# Patient Record
Sex: Male | Born: 1985 | Race: Black or African American | Hispanic: No | Marital: Single | Smoking: Current every day smoker
Health system: Southern US, Community
[De-identification: ages and names within clinical notes are randomized; demographics above are authoritative.]

## PROBLEM LIST (undated history)

## (undated) DIAGNOSIS — I10 Essential (primary) hypertension: Secondary | ICD-10-CM

---

## 2018-02-04 ENCOUNTER — Other Ambulatory Visit: Payer: Self-pay

## 2018-02-04 ENCOUNTER — Emergency Department (HOSPITAL_BASED_OUTPATIENT_CLINIC_OR_DEPARTMENT_OTHER)
Admission: EM | Admit: 2018-02-04 | Discharge: 2018-02-04 | Disposition: A | Discharge status: Home / Self Care | Payer: Self-pay | Attending: Emergency Medicine | Admitting: Emergency Medicine

## 2018-02-04 ENCOUNTER — Emergency Department (HOSPITAL_BASED_OUTPATIENT_CLINIC_OR_DEPARTMENT_OTHER): Payer: Self-pay

## 2018-02-04 ENCOUNTER — Encounter (HOSPITAL_BASED_OUTPATIENT_CLINIC_OR_DEPARTMENT_OTHER): Payer: Self-pay | Admitting: *Deleted

## 2018-02-04 DIAGNOSIS — M79671 Pain in right foot: Secondary | ICD-10-CM | POA: Insufficient documentation

## 2018-02-04 DIAGNOSIS — F17210 Nicotine dependence, cigarettes, uncomplicated: Secondary | ICD-10-CM | POA: Insufficient documentation

## 2018-02-04 DIAGNOSIS — I10 Essential (primary) hypertension: Secondary | ICD-10-CM | POA: Insufficient documentation

## 2018-02-04 DIAGNOSIS — R030 Elevated blood-pressure reading, without diagnosis of hypertension: Secondary | ICD-10-CM

## 2018-02-04 HISTORY — DX: Essential (primary) hypertension: I10

## 2018-02-04 NOTE — ED Notes (Signed)
Pt verbalizes understanding of d/c instructions and denies any further needs at this time. 

## 2018-02-04 NOTE — ED Triage Notes (Signed)
Pt has been having right foot pain X 4 days, intensified yesterday. No trauma to area. Unsure what brought on the pain.

## 2018-02-04 NOTE — Discharge Instructions (Addendum)
Please return to the Emergency Department for any new or worsening symptoms or if your symptoms do not improve. Please be sure to follow up with your Primary Care Physician as soon as possible regarding your visit today. If you do not have a Primary Doctor please use the resources below to establish one. Please use rest ice and elevation to help relieve your foot pain.  You may also use the crutches as needed to help with ambulation and rest your foot.  You may use over-the-counter anti-inflammatory such as ibuprofen to help with your pain, use as directed on the packaging. Please follow-up with the sports medicine doctor Dr. Pearletha Forge for further evaluation of your foot pain. Your blood pressure was elevated today, please take your blood pressure medications as directed by your primary care doctor.  Please follow-up with your primary care doctor for a blood pressure recheck and medication management as soon as possible.  Contact a health care provider if: Your pain does not get better after a few days of self-care. Your pain gets worse. You cannot stand on your foot. Get help right away if: Your foot is numb or tingling. Your foot or toes are swollen. Your foot or toes turn white or blue. You have warmth and redness along your foot. Contact a doctor if: You think you are having a reaction to the medicine you are taking. You have headaches that keep coming back (recurring). You feel dizzy. You have swelling in your ankles. You have trouble with your vision. Get help right away if: You get a very bad headache. You start to feel confused. You feel weak or numb. You feel faint. You get very bad pain in your: Chest. Belly (abdomen). You throw up (vomit) more than once. You have trouble breathing.   Do not take your medicine if  develop an itchy rash, swelling in your mouth or lips, or difficulty breathing.   RESOURCE GUIDE  Chronic Pain Problems: Contact [facility]  [phone] Patients need to be referred by their primary care doctor.  Insufficient Money for Medicine: Contact United Way:  call "211" or [facility] (304)412-2048.  No Primary Care Doctor: Call [facility]  [phone] - can help you locate a primary care doctor that  accepts your insurance, provides certain services, etc. Physician Referral [phone]  Agencies that provide inexpensive medical care: [facility]  [phone] [facility]  [phone] [facility]  [phone] [facility]  [phone] Planned [facility]  [phone] [facility]  [phone]  Medicaid-accepting [facility] Providers: [facility]- [address]  [phone], Mon-Fri 9am-7pm, Sat 9am-1pm [facility]- [address] [place], [address] [place]  [phone] [facility]- [address] [place]  [phone] [facility]- [address]  [phone] Renaye Rakers- [address] [place], [address], [phone]  Only accepts [place] patients after they have their name  applied to their card  Self Pay (no insurance) in [facility]: Sickle Cell Patients: Dr Willey Blade, [facility]  [address] [place], [phone] [facility]- [address] [place]  [phone]       [facility] [place]- [address]       -     [facility]- see information above (Speak to [employer]  if you do not have insurance)       -  [facility]- [address] [place], [phone]       -  [facility]- [address],  [phone]       -  [facility]- [address] [phone]       -  Dr Julio Sicks-  [address], [place], [phone]       -  [facility]- [address], [phone]       -  [facility]- [address], [phone], also [address], [phone]       -    [facility]- [address] [place], [phone], 1st & 3rd Saturday   every month, 10am-1pm  1) Find a Doctor and Pay Out of Pocket Although you won't have to find out who is covered by your insurance plan, it is a good idea to ask around and get recommendations. You will then need to call the office and see if the doctor you have chosen will accept you as a new patient and what types of options they offer for patients who are self-pay. Some doctors offer discounts or will set up payment plans for their patients who do not have insurance, but you will need to ask so you aren't surprised when you get to your appointment.  2) Contact Your Local Health Department Not all health departments have doctors that can see patients for sick visits, but many do, so it is worth a call to see if yours does. If you don't know where your local health department is, you can check in your phone book. The CDC also has a tool to help you locate your state's health department, and many state websites also have listings of all of their local health departments.  3) Find a Walk-in Clinic If your illness is not likely to be very severe or complicated, you may want to try a walk in clinic. These are popping up all over the country in pharmacies, drugstores, and shopping centers. They're usually staffed by nurse practitioners or physician assistants that have been trained to treat common illnesses and complaints. They're usually fairly quick and inexpensive. However, if you have serious medical issues or chronic medical problems, these are probably not your best option  STD Testing [facility] Department of [facility], STD Clinic, [address] [place], phone [phone] or [phone].  Monday - Friday, call for an appointment. [facility] Department of [employer], STD Clinic, [address], [place], phone [phone] or [phone].   Monday - Friday, call for an appointment.  Abuse/Neglect: [facility] Child Abuse Hotline 314-038-3883 [facility] Child Abuse Hotline 3134151498 (After Hours)  Emergency Shelter:  [facility] 253-072-8189  Maternity Homes: [facility] (249)192-3201 [facility] 206-285-7808  MRSA Hotline #:   7168180918  [facility] Resources  [facility]  [employer] Way [facility] Dept. [address]                 [address]         [address] [place] [address]  [address]  Cristobal Goldmann Phone:  [phone]                                  Phone:  [phone]                   Phone:  [phone]  [facility], [phone] [facility]- [phone]       -     [facility] in [place], [address],                                  2891066632, [facility] Child Abuse Hotline 959-472-7608 or 734-797-3323 (After Hours)   Behavioral Health Services  Substance Abuse Resources: [facility] Drug Services  (208)742-9653 [facility] [phone] [facility] [place] [phone] Floydene Flock [phone] Residential & Outpatient Substance Abuse Program  306-774-5996  Psychological Services: [facility]  [phone] [facility]  ([phone] [facility], [phone] [place]. [address] [place], ACCESS LINE: 518-839-0819 or 717-556-8821, [employer]  Dental Assistance  If unable to pay or uninsured, contact:  [facility] or [facility]. to become qualified for the adult dental clinic.  Patients with Medicaid: [facility] [phone] [address], [phone] [address] [phone]  If unable to  pay, or uninsured, contact [facility] (765) 370-5508) or [facility] Department [phone])[phone] in [place], [phone] in [place]) to become qualified for the adult dental clinic   Other Low-Cost Community Dental Services: [facility]- [address] [place], [place], [place], [address], [phone], Ext. [phone], 2nd and 4th Thursday of the month at 6:30am.  10 clients each day by appointment, can sometimes see walk-in patients if someone does not show for an appointment. [facility]- [address] [place], [place], [address], [phone] [facility] [address] [place], [place], [address], [phone] [facility] Health Department- [phone] [facility] Health Department- [phone] [facility] [phone]

## 2018-02-04 NOTE — ED Provider Notes (Signed)
[facility] EMERGENCY DEPARTMENT Provider Note   CSN: [id number] Arrival date & time: 02/04/18  1313     History   Chief Complaint Chief Complaint  Patient presents with  . Foot Pain    HPI Bruce Wright is a 32 y.o. male presenting for 4 days of right foot pain that he describes it at this a constant throbbing pain that is moderate in severity.  States that pain is worse with ambulation and palpation to the bottom of the foot.  Patient denies trauma or injury to the area.  Patient states that he has had this pain a few years ago in the past that went away on its own.  Patient states that at that time he was informed to use diet control for his weight and rest the foot.  He states that this worked for a time.  Patient denies fever, foot swelling/color change, injury, numbness/tingling or weakness.  Patient denies headache, vision changes, chest pain or shortness of breath.  HPI  Past Medical History:  Diagnosis Date  . Hypertension     There are no active problems to display for this patient.   History reviewed. No pertinent surgical history.      Home Medications    Prior to Admission medications   Not on File    Family History History reviewed. No pertinent family history.  Social History Social History   Tobacco Use  . Smoking status: Current Every Day Smoker    Packs/day: 0.50  . Smokeless tobacco: Never Used  Substance Use Topics  . Alcohol use: Yes    Alcohol/week: 1.0 standard drinks    Types: 1 Shots of liquor per week  . Drug use: Yes    Types: Marijuana     Allergies   Patient has no known allergies.   Review of Systems Review of Systems  Constitutional: Negative.  Negative for chills and fever.  Eyes: Negative.  Negative for visual disturbance.  Respiratory: Negative.  Negative for shortness of breath.   Cardiovascular: Negative.  Negative for chest pain.  Musculoskeletal: Positive for arthralgias. Negative for joint  swelling and neck pain.  Skin: Negative.  Negative for color change and wound.  Neurological: Negative.  Negative for weakness, numbness and headaches.    Physical Exam Updated Vital Signs BP (!) 162/119 (BP Location: Left Arm)   Pulse 82   Temp 99.1 F (37.3 C) (Oral)   Resp 18   Ht 5\' 11"  (1.803 m)   Wt (!) 161.9 kg   SpO2 100%   BMI 49.79 kg/m   Physical Exam  Constitutional: He appears well-developed and well-nourished. No distress.  HENT:  Head: Normocephalic and atraumatic.  Right Ear: External ear normal.  Left Ear: External ear normal.  Nose: Nose normal.  Eyes: Pupils are equal, round, and reactive to light. EOM are normal.  Neck: Trachea normal and normal range of motion. No tracheal deviation present.  Cardiovascular:  Pulses:      Dorsalis pedis pulses are 2+ on the right side, and 2+ on the left side.       Posterior tibial pulses are 2+ on the right side, and 2+ on the left side.  Pulmonary/Chest: Effort normal. No respiratory distress.  Abdominal: Soft. There is no tenderness. There is no rebound and no guarding.  Musculoskeletal: Normal range of motion.       Right ankle: Normal.       Left ankle: Normal.       Right lower  leg: Normal.       Left lower leg: Normal.       Right foot: There is tenderness. There is no swelling, normal capillary refill and no deformity.       Left foot: Normal. There is no deformity.  Feet:  Right Foot:  Protective Sensation: 3 sites tested. 3 sites sensed.  Left Foot:  Protective Sensation: 3 sites tested. 3 sites sensed.  Neurological: He is alert. He has normal strength. No sensory deficit. GCS eye subscore is 4. GCS verbal subscore is 5. GCS motor subscore is 6.  Speech is clear and goal oriented, follows commands Major Cranial nerves without deficit, no facial droop Normal strength in upper and lower extremities bilaterally including dorsiflexion and plantar flexion, strong and equal grip strength Sensation normal to  light touch Moves extremities without ataxia, coordination intact  Skin: Skin is warm and dry. Capillary refill takes less than 2 seconds. No erythema.  Psychiatric: He has a normal mood and affect. His behavior is normal.   ED Treatments / Results  Labs (all labs ordered are listed, but only abnormal results are displayed) Labs Reviewed - No data to display  EKG None  Radiology Dg Ankle Complete Right  Result Date: 02/04/2018 CLINICAL DATA:  Initial encounter. 32 y/o male with RIGHT foot/ankle pain. NKI. Started at 5th digit, now everywhere. No prior injury. Pt will not bear weight on that side. EXAM: RIGHT ANKLE - COMPLETE 3+ VIEW COMPARISON:  None. FINDINGS: No fracture.  No bone lesion. Ankle joint normally spaced and aligned.  No arthropathic changes. Small dorsal calcaneal spur. Soft tissues are unremarkable. IMPRESSION: No fracture, bone lesion or ankle joint abnormality. Electronically Signed   By: Amie Portland M.D.   On: 02/04/2018 15:19   Dg Foot Complete Right  Result Date: 02/04/2018 CLINICAL DATA:  Initial encounter. 32 y/o male with RIGHT foot/ankle pain. NKI. Started at 5th digit, now everywhere. No prior injury. Pt will not bear weight on that side. EXAM: RIGHT FOOT COMPLETE - 3+ VIEW COMPARISON:  None. FINDINGS: No fracture.  No bone lesion. Joints are normally spaced and aligned.  No arthropathic changes. Soft tissues are unremarkable. IMPRESSION: Negative. Electronically Signed   By: Amie Portland M.D.   On: 02/04/2018 15:57    Procedures Procedures (including critical care time)  Medications Ordered in ED Medications - No data to display   Initial Impression / Assessment and Plan / ED Course  I have reviewed the triage vital signs and the nursing notes.  Pertinent labs & imaging results that were available during my care of the patient were reviewed by me and considered in my medical decision making (see chart for details).    The patient was noted to have  elevated BP in ED today. Hx of HTN and has not taken daily medications today. I have spoken with the patient regarding elevated blood pressure readings and the need for improved management. I instructed the patient to followup with their PCP as soon as possible for BP check. I also counseled the patient regarding the signs and symptoms which would require an emergent visit to an emergency department for hypertensive urgency and/or hypertensive emergency.  Patient presenting for right foot pain that began 4 days ago.  Imaging of right foot and ankle negative.  No history of trauma or injury.  Patient is neurovascularly intact to the foot with full range of motion with increased pain.  Pedal pulses intact and equal bilaterally.  No obvious swelling  or deformity present.  Skin is intact, no erythema or color change.  No increased warmth.  Doubt gout at this time.  Sensation to light touch intact.  Capillary refill intact all toes.  Patient given postop shoe and crutches emergency department.  Patient encouraged to use rest, ice and elevation to help with pain.  Patient also informed that he may use over-the-counter anti-inflammatories as directed on the packaging for pain.  Patient has been given sports medicine follow-up for further evaluation of his foot pain.  At this time there does not appear to be any evidence of an acute emergency medical condition and the patient appears stable for discharge with appropriate outpatient follow up. Diagnosis was discussed with patient who verbalizes understanding of care plan and is agreeable to discharge. I have discussed return precautions with patient who verbalizes understanding of return precautions. Patient strongly encouraged to follow-up with their PCP. All questions answered.  Patient's case discussed with Dr. Dalene Seltzer who agrees with plan to discharge with follow-up.     Note: Portions of this report may have been transcribed using voice recognition  software. Every effort was made to ensure accuracy; however, inadvertent computerized transcription errors may still be present.  Final Clinical Impressions(s) / ED Diagnoses   Final diagnoses:  Foot pain, right  Elevated blood pressure reading    ED Discharge Orders    None       Elizabeth Palau 02/04/18 2051    Alvira Monday, MD 02/05/18 1232

## 2019-06-05 IMAGING — DX DG FOOT COMPLETE 3+V*R*
3 series · 3 of 3 positions shown · non-contrast
Comparison: None.

CLINICAL DATA: Initial encounter. 31 y/o male with RIGHT foot/ankle
pain. NKI. Started at 5th digit, now everywhere. No prior injury. Pt
will not bear weight on that side.

EXAM:
RIGHT FOOT COMPLETE - 3+ VIEW

[foot ap]
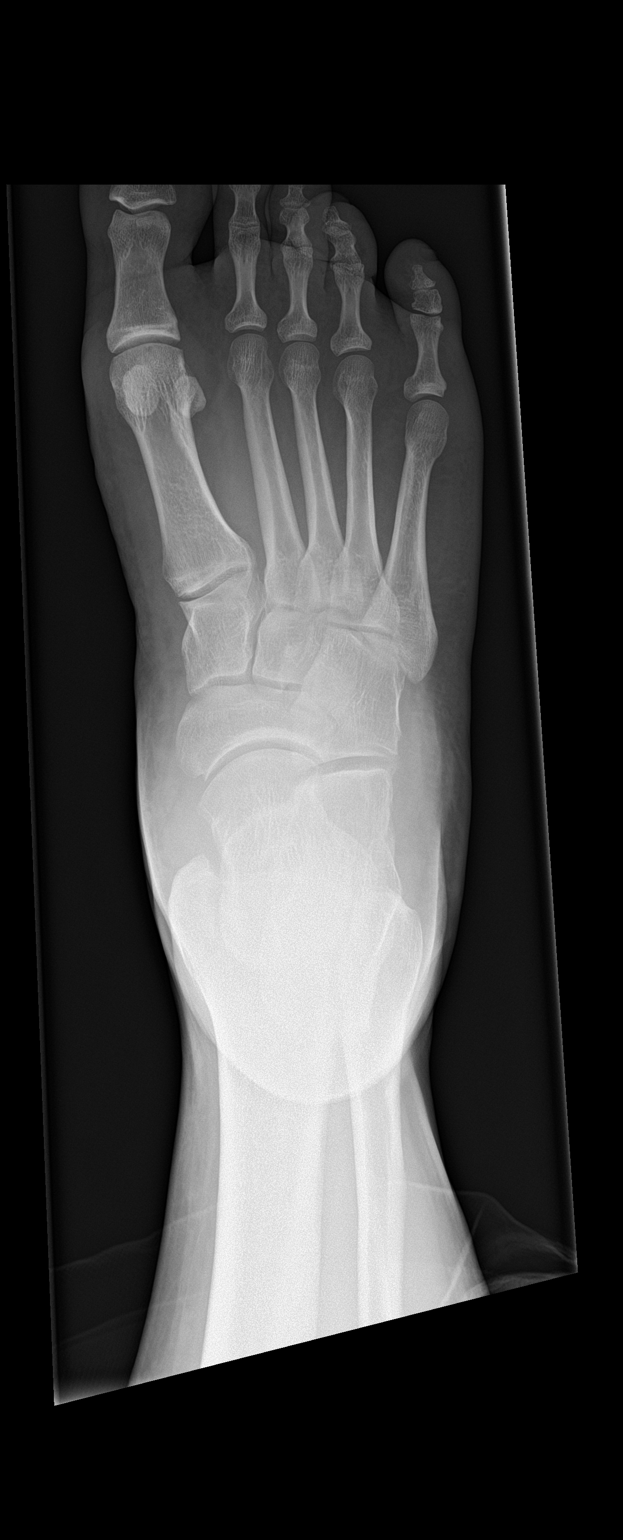

[foot obl]
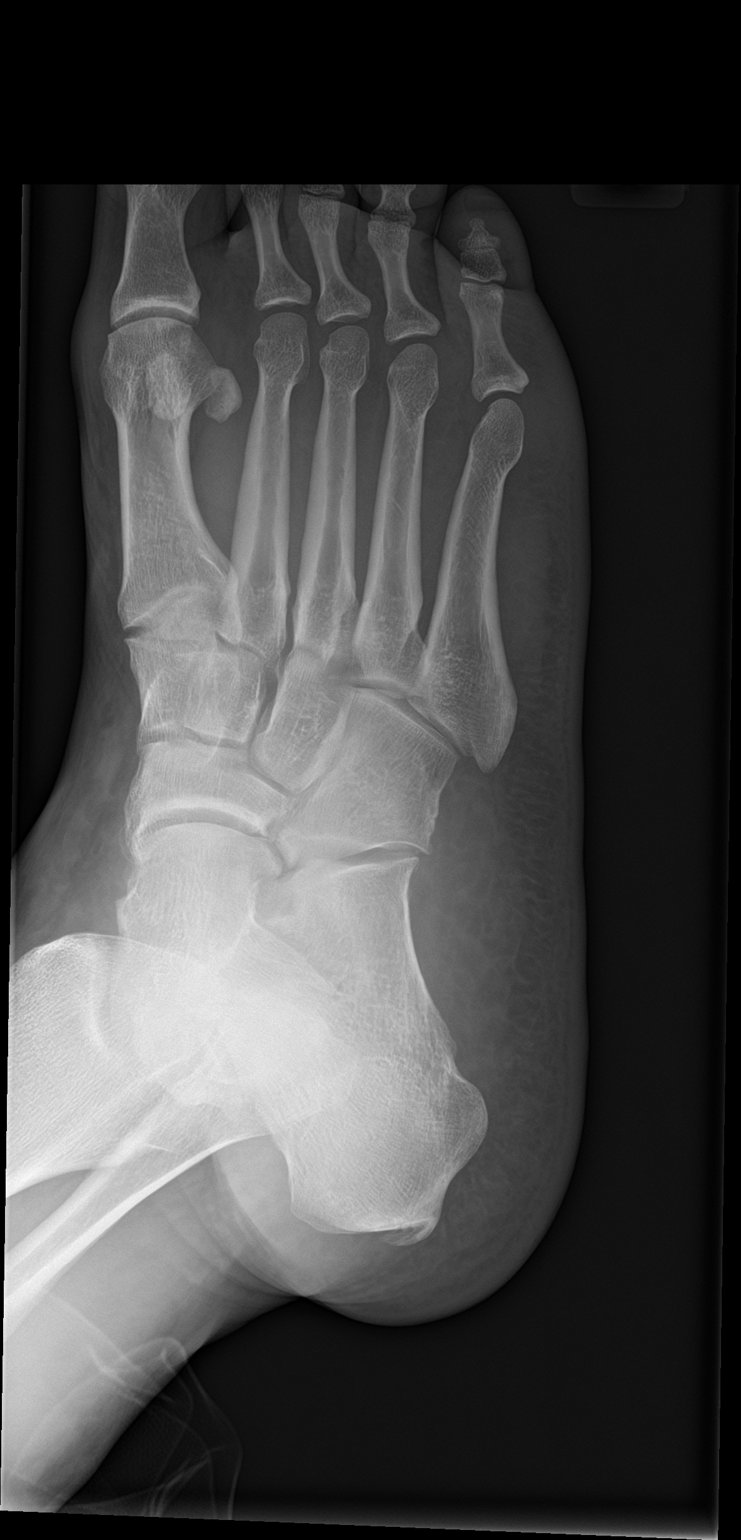

[foot lat]
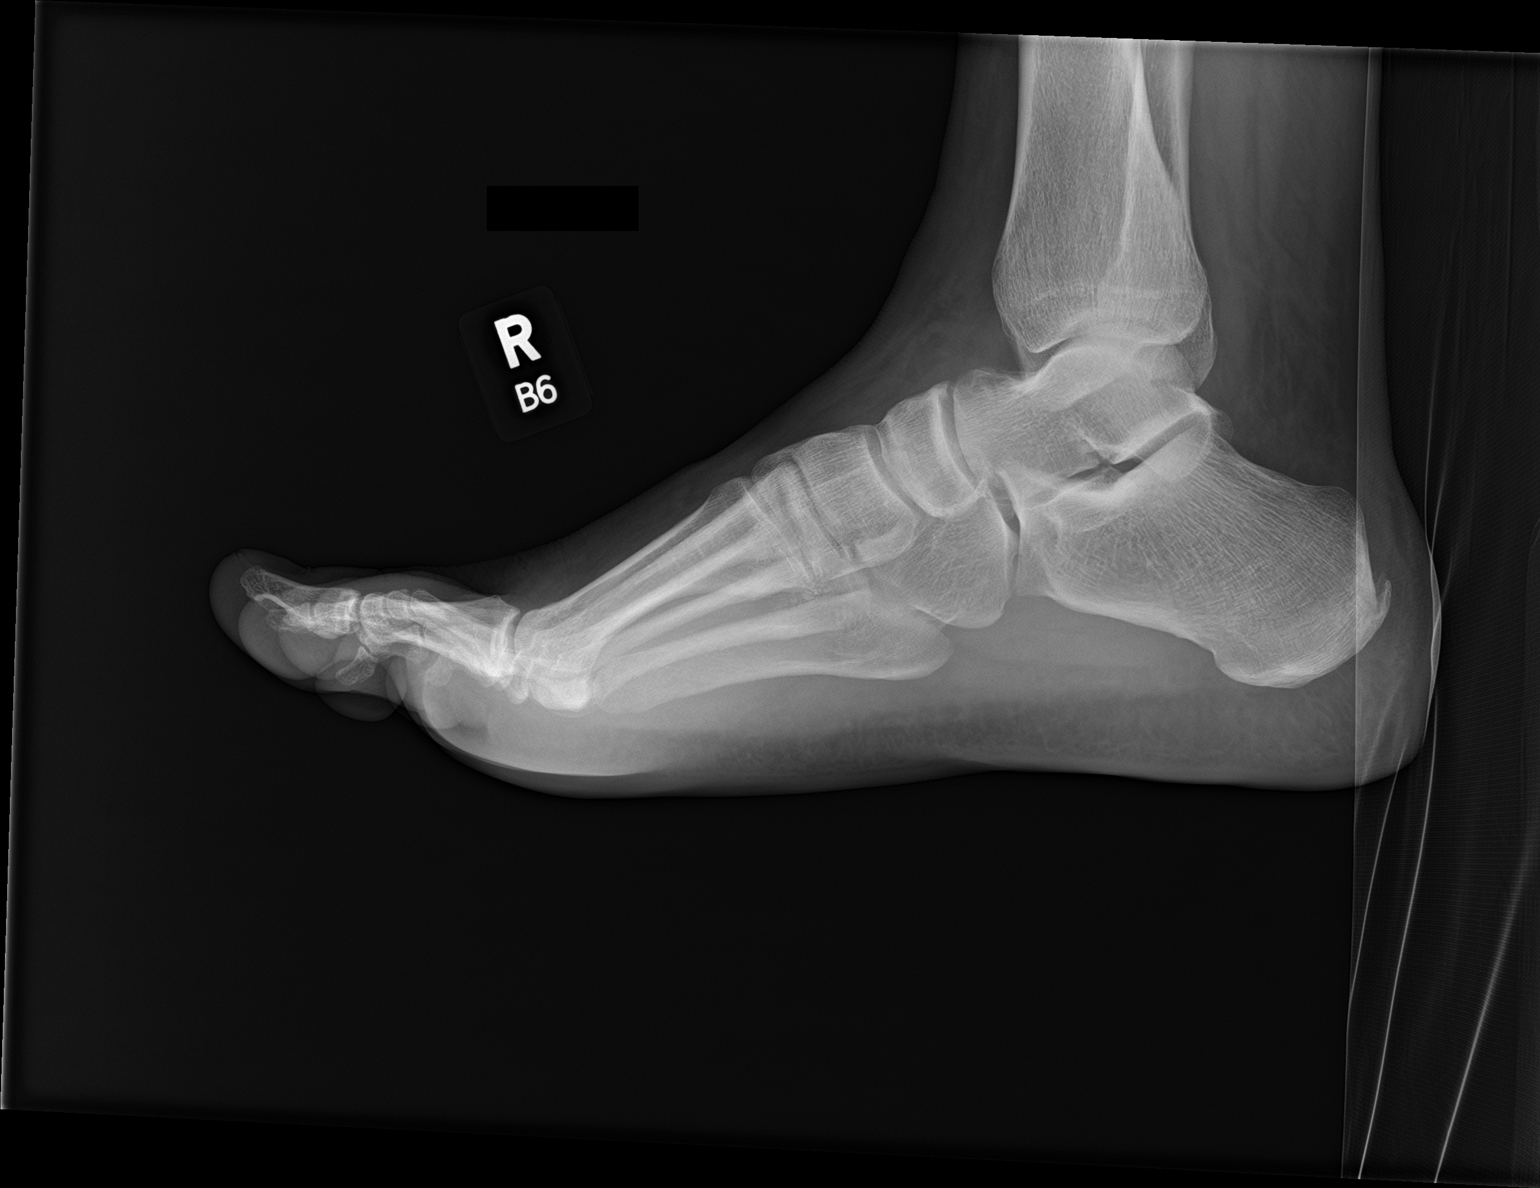

[3 of 3 positions shown; findings below may reference images not displayed]

FINDINGS: No fracture.  No bone lesion.

Joints are normally spaced and aligned.  No arthropathic changes.

Soft tissues are unremarkable.
IMPRESSION: Negative.
